# Patient Record
Sex: Male | Born: 1970
Health system: Southern US, Community
[De-identification: ages and names within clinical notes are randomized; demographics above are authoritative.]

## PROBLEM LIST (undated history)

## (undated) DIAGNOSIS — T7840XA Allergy, unspecified, initial encounter: Secondary | ICD-10-CM

## (undated) DIAGNOSIS — K200 Eosinophilic esophagitis: Secondary | ICD-10-CM

## (undated) DIAGNOSIS — K219 Gastro-esophageal reflux disease without esophagitis: Secondary | ICD-10-CM

## (undated) DIAGNOSIS — J45909 Unspecified asthma, uncomplicated: Secondary | ICD-10-CM

## (undated) DIAGNOSIS — E785 Hyperlipidemia, unspecified: Secondary | ICD-10-CM

## (undated) HISTORY — DX: Eosinophilic esophagitis: K20.0

## (undated) HISTORY — DX: Unspecified asthma, uncomplicated: J45.909

## (undated) HISTORY — PX: CHOLECYSTECTOMY: SHX55

## (undated) HISTORY — DX: Hyperlipidemia, unspecified: E78.5

## (undated) HISTORY — DX: Gastro-esophageal reflux disease without esophagitis: K21.9

## (undated) HISTORY — DX: Allergy, unspecified, initial encounter: T78.40XA

---

## 2017-01-21 ENCOUNTER — Encounter: Payer: Self-pay | Admitting: Gastroenterology

## 2017-03-27 ENCOUNTER — Ambulatory Visit: Payer: Self-pay | Admitting: Gastroenterology

## 2017-04-09 ENCOUNTER — Encounter: Payer: Self-pay | Admitting: Gastroenterology

## 2017-04-09 ENCOUNTER — Encounter (INDEPENDENT_AMBULATORY_CARE_PROVIDER_SITE_OTHER): Payer: Self-pay

## 2017-04-09 ENCOUNTER — Ambulatory Visit (INDEPENDENT_AMBULATORY_CARE_PROVIDER_SITE_OTHER): Payer: No Typology Code available for payment source | Admitting: Gastroenterology

## 2017-04-09 VITALS — BP 106/76 | HR 72 | Ht 69.29 in | Wt 217.1 lb

## 2017-04-09 DIAGNOSIS — Z800 Family history of malignant neoplasm of digestive organs: Secondary | ICD-10-CM

## 2017-04-09 DIAGNOSIS — K219 Gastro-esophageal reflux disease without esophagitis: Secondary | ICD-10-CM

## 2017-04-09 NOTE — Patient Instructions (Addendum)
Colonoscopy recall September 2020 (at age 46).  Try changing to ranitidine 150mg  PRN.  Call if you have bowel changes or swallowing difficulties.  Normal BMI (Body Mass Index- based on height and weight) is between 19 and 25. Your BMI today is Body mass index is 31.8 kg/m. Marland Kitchen Please consider follow up  regarding your BMI with your Primary Care Provider.

## 2017-04-09 NOTE — Progress Notes (Signed)
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    Chief complaint is family history colon cancer, intermittent GERD, external hemorrhoids  Colonoscopy 5 years ago in [place], tells me that it was normal. His paternal grandfather had colon cancer. His father had one or 2 polyps removed over the past many years.   Was found to have EoE years ago as well by pathology.  Was on flow-vent. Food tended to go slow.  Symptoms completely responded to PPI.  Pretty rare swallowing issues now.  Twice a year.    Takes PPI only occasionally for heartburn symptoms.  Takes 1-2 times in a month  Sept 2013 normal colonoscopy  He tells me he had a thrombosed hemorrhoid many years ago. He thinks he still has external hemorrhoid tissue but it never really bothers him.  He has no overt GI bleeding, no changes in his bowels recently. He has been trying to lose weight and successfully has lost about 10 pounds in past year or so.  He has really cut back on caffeine. He notices that eating a large meal later at night indefinite predisposing to getting dyspepsia, GERD when laying down.   Old Data Reviewed: None available    Review of systems: Pertinent positive and negative review of systems were noted in the above HPI section. All other review negative.   Past Medical History:  Diagnosis Date  . Asthma   . Eosinophilic esophagitis     Past Surgical History:  Procedure Laterality Date  . CHOLECYSTECTOMY      Current Outpatient Prescriptions  Medication Sig Dispense Refill  . albuterol (PROVENTIL HFA;VENTOLIN HFA) 108 (90 Base) MCG/ACT inhaler Inhale into the lungs as needed for wheezing or shortness of breath. As needed with exercise    . esomeprazole (NEXIUM) 20 MG capsule Take 20 mg by mouth as needed. Over the counter     No current  facility-administered medications for this visit.     Allergies as of 04/09/2017  . (No Known Allergies)    Family History  Problem Relation Age of Onset  . Breast cancer Mother   . ALS Father   . Healthy Sister   . Lymphoma Maternal Grandfather   . Colon cancer Paternal Grandfather     Social History   Social History  . Marital status: Married    Spouse name: N/A  . Number of children: 3  . Years of education: N/A   Occupational History  . radiologist    Social History Main Topics  . Smoking status: Never Smoker  . Smokeless tobacco: Never Used  . Alcohol use Not on file     Comment: occasional  . Drug use: No  . Sexual activity: Not on file   Other Topics Concern  . Not on file   Social History Narrative  . No narrative on file     Physical Exam: Ht 5' 9.29" (1.76 m)   Wt 217 lb 2 oz (98.5 kg)   BMI 31.80 kg/m  Constitutional: generally well-appearing Psychiatric: alert and oriented x3 Eyes: extraocular movements intact Mouth: oral pharynx moist, no lesions Neck: supple no lymphadenopathy Cardiovascular: heart regular rate and rhythm Lungs: clear to auscultation bilaterally Abdomen: soft, nontender, nondistended, no obvious ascites, no peritoneal signs, normal bowel sounds Extremities: no lower extremity edema bilaterally Skin: no lesions on visible extremities  Assessment and plan: 46 y.o. male with  second degree relative with colon cancer, GERD, intermittent dysphasia.  First we discussed family history of colon cancer The second degree relatives such as his grandmother does not significantly increase his own risk. His father did have colon polyps removed and he is very certain there precancerous. We discussed the timing of next screening examination and we agreed to start routine screening at age 27. That would be 7 or 8 years from his first colonoscopy. If it is normal with no precancerous polyps and we would put him in for routine 10 year  recall.  I'm not certain that he truly had eosinophilic esophagitis or just a preponderance of eosinophils related to GERD. His dysphagia symptoms significantly improved with proton pump inhibitor. He never had repeat biopsy after proton pump inhibitors as is more standard care now compared to 5 years ago when he was told he had eosinophilic esophagitis and he really doesn't have much dysphagia now. Maybe once a year or so. I recommended he does take proton pump inhibitor or even H2 blocker on as needed basis for now and to call if he has developed more routine dysphasia or significant GERD symptoms.     Please see the "Patient Instructions" section for addition details about the plan.   Owens Loffler, MD [facility] 04/09/2017, 8:36 AM  Cc: Velna Hatchet, MD

## 2017-11-12 ENCOUNTER — Other Ambulatory Visit (HOSPITAL_COMMUNITY): Payer: Self-pay | Admitting: Internal Medicine

## 2017-11-12 DIAGNOSIS — E041 Nontoxic single thyroid nodule: Secondary | ICD-10-CM

## 2017-11-19 ENCOUNTER — Other Ambulatory Visit: Payer: No Typology Code available for payment source

## 2017-11-19 ENCOUNTER — Ambulatory Visit
Admission: RE | Admit: 2017-11-19 | Discharge: 2017-11-19 | Disposition: A | Discharge status: Home / Self Care | Payer: No Typology Code available for payment source | Source: Ambulatory Visit | Attending: Internal Medicine | Admitting: Internal Medicine

## 2017-11-19 DIAGNOSIS — E041 Nontoxic single thyroid nodule: Secondary | ICD-10-CM

## 2017-11-21 ENCOUNTER — Other Ambulatory Visit (HOSPITAL_COMMUNITY): Payer: Self-pay | Admitting: Internal Medicine

## 2017-11-21 DIAGNOSIS — E041 Nontoxic single thyroid nodule: Secondary | ICD-10-CM

## 2017-11-26 ENCOUNTER — Other Ambulatory Visit (HOSPITAL_COMMUNITY)
Admission: RE | Admit: 2017-11-26 | Discharge: 2017-11-26 | Disposition: A | Discharge status: Home / Self Care | Payer: No Typology Code available for payment source | Source: Ambulatory Visit | Attending: Radiology | Admitting: Radiology

## 2017-11-26 ENCOUNTER — Ambulatory Visit
Admission: RE | Admit: 2017-11-26 | Discharge: 2017-11-26 | Disposition: A | Discharge status: Home / Self Care | Payer: No Typology Code available for payment source | Source: Ambulatory Visit | Attending: Internal Medicine | Admitting: Internal Medicine

## 2017-11-26 DIAGNOSIS — E041 Nontoxic single thyroid nodule: Secondary | ICD-10-CM

## 2018-10-20 DIAGNOSIS — E7849 Other hyperlipidemia: Secondary | ICD-10-CM | POA: Diagnosis not present

## 2018-10-20 DIAGNOSIS — E041 Nontoxic single thyroid nodule: Secondary | ICD-10-CM | POA: Diagnosis not present

## 2018-10-20 DIAGNOSIS — Z125 Encounter for screening for malignant neoplasm of prostate: Secondary | ICD-10-CM | POA: Diagnosis not present

## 2018-10-20 DIAGNOSIS — Z0000 Encounter for general adult medical examination without abnormal findings: Secondary | ICD-10-CM | POA: Diagnosis not present

## 2018-10-20 DIAGNOSIS — R7309 Other abnormal glucose: Secondary | ICD-10-CM | POA: Diagnosis not present

## 2018-10-21 DIAGNOSIS — R7309 Other abnormal glucose: Secondary | ICD-10-CM | POA: Diagnosis not present

## 2018-10-21 DIAGNOSIS — E785 Hyperlipidemia, unspecified: Secondary | ICD-10-CM | POA: Diagnosis not present

## 2018-10-21 DIAGNOSIS — K200 Eosinophilic esophagitis: Secondary | ICD-10-CM | POA: Diagnosis not present

## 2018-10-21 DIAGNOSIS — E041 Nontoxic single thyroid nodule: Secondary | ICD-10-CM | POA: Diagnosis not present

## 2018-10-21 DIAGNOSIS — Z0000 Encounter for general adult medical examination without abnormal findings: Secondary | ICD-10-CM | POA: Diagnosis not present

## 2018-10-21 DIAGNOSIS — Z1331 Encounter for screening for depression: Secondary | ICD-10-CM | POA: Diagnosis not present

## 2018-11-24 DIAGNOSIS — H5213 Myopia, bilateral: Secondary | ICD-10-CM | POA: Diagnosis not present

## 2018-11-24 DIAGNOSIS — H2513 Age-related nuclear cataract, bilateral: Secondary | ICD-10-CM | POA: Diagnosis not present

## 2018-12-24 DIAGNOSIS — E7849 Other hyperlipidemia: Secondary | ICD-10-CM | POA: Diagnosis not present

## 2019-02-20 ENCOUNTER — Encounter: Payer: Self-pay | Admitting: Gastroenterology

## 2019-03-31 DIAGNOSIS — D2272 Melanocytic nevi of left lower limb, including hip: Secondary | ICD-10-CM | POA: Diagnosis not present

## 2019-03-31 DIAGNOSIS — D225 Melanocytic nevi of trunk: Secondary | ICD-10-CM | POA: Diagnosis not present

## 2019-03-31 DIAGNOSIS — D2262 Melanocytic nevi of left upper limb, including shoulder: Secondary | ICD-10-CM | POA: Diagnosis not present

## 2019-03-31 DIAGNOSIS — L821 Other seborrheic keratosis: Secondary | ICD-10-CM | POA: Diagnosis not present

## 2019-04-24 ENCOUNTER — Encounter: Payer: Self-pay | Admitting: Gastroenterology

## 2019-05-26 ENCOUNTER — Ambulatory Visit (AMBULATORY_SURGERY_CENTER): Payer: Self-pay | Admitting: *Deleted

## 2019-05-26 ENCOUNTER — Other Ambulatory Visit: Payer: Self-pay

## 2019-05-26 ENCOUNTER — Telehealth: Payer: Self-pay | Admitting: *Deleted

## 2019-05-26 VITALS — Temp 96.2°F | Ht 69.0 in | Wt 225.6 lb

## 2019-05-26 DIAGNOSIS — Z1211 Encounter for screening for malignant neoplasm of colon: Secondary | ICD-10-CM

## 2019-05-26 MED ORDER — NA SULFATE-K SULFATE-MG SULF 17.5-3.13-1.6 GM/177ML PO SOLN: 1.0000 | Freq: Once | ORAL | 0 refills | Status: AC

## 2019-05-26 NOTE — Telephone Encounter (Signed)
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

## 2019-05-26 NOTE — Progress Notes (Signed)
No egg or soy allergy known to patient  No issues with past sedation with any surgeries  or procedures, no intubation problems  No diet pills per patient No home 02 use per patient  No blood thinners per patient  Pt denies issues with constipation  No A fib or A flutter  EMMI video sent to pt's e mail   Due to the COVID-19 pandemic we are asking patients to follow these guidelines. Please only bring one care partner. Please be aware that your care partner may wait in the car in the parking lot or if they feel like they will be too hot to wait in the car, they may wait in the lobby on the 4th floor. All care partners are required to wear a mask the entire time (we do not have any that we can provide them), they need to practice social distancing, and we will do a Covid check for all patient's and care partners when you arrive. Also we will check their temperature and your temperature. If the care partner waits in their car they need to stay in the parking lot the entire time and we will call them on their cell phone when the patient is ready for discharge so they can bring the car to the front of the building. Also all patient's will need to wear a mask into building.  SUPREP PREP COUPON PROVIDED.

## 2019-05-27 ENCOUNTER — Encounter: Payer: Self-pay | Admitting: Gastroenterology

## 2019-05-27 NOTE — Telephone Encounter (Signed)
Dr. Ardis Hughs, spoke with pt. 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

## 2019-05-27 NOTE — Telephone Encounter (Signed)
Received voicemail,message left for pt,to call back.

## 2019-05-27 NOTE — Telephone Encounter (Signed)
Pt calling back and would like to speak to [facility]

## 2019-05-27 NOTE — Telephone Encounter (Signed)
His colonoscopy is in 2 weeks.  He has 3 options for covid testing sites ([facility], [place] [address] through, [place] [address] through).  Is he unable to get to any of those testing sites at the correct time?

## 2019-05-28 NOTE — Telephone Encounter (Signed)
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   Pt thanked me for working so hard with Adela Lank to get him scheduled and helping with his Delima and was grateful for the assistance.     Lelan Pons PV

## 2019-05-28 NOTE — Telephone Encounter (Signed)
Please let him know I understand this is a definite inconvenience but with the pandemic we have new safety, testing policies.  We do not accept 'outside' test results at this time and I don't expect that to change anytime soon.  I understand if he prefers to reschedule to a more convenient date that allows for him to get pre-procedure testing at one of the 'approved' sites.

## 2019-05-28 NOTE — Telephone Encounter (Signed)
Pt requesting to speak with [name] about the Covid test

## 2019-05-28 NOTE — Telephone Encounter (Signed)
Called pt- got VM- LM to return my call-

## 2019-06-04 ENCOUNTER — Other Ambulatory Visit: Payer: Self-pay | Admitting: Gastroenterology

## 2019-06-04 ENCOUNTER — Ambulatory Visit (INDEPENDENT_AMBULATORY_CARE_PROVIDER_SITE_OTHER): Payer: BC Managed Care – PPO

## 2019-06-04 DIAGNOSIS — Z1159 Encounter for screening for other viral diseases: Secondary | ICD-10-CM

## 2019-06-05 LAB — SARS CORONAVIRUS 2 (TAT 6-24 HRS): SARS Coronavirus 2: NEGATIVE

## 2019-06-09 ENCOUNTER — Other Ambulatory Visit: Payer: Self-pay

## 2019-06-09 ENCOUNTER — Ambulatory Visit (AMBULATORY_SURGERY_CENTER): Payer: BC Managed Care – PPO | Admitting: Gastroenterology

## 2019-06-09 ENCOUNTER — Encounter: Payer: Self-pay | Admitting: Gastroenterology

## 2019-06-09 VITALS — BP 107/74 | HR 78 | Temp 98.0°F | Resp 15 | Ht 69.0 in | Wt 225.0 lb

## 2019-06-09 DIAGNOSIS — D125 Benign neoplasm of sigmoid colon: Secondary | ICD-10-CM

## 2019-06-09 DIAGNOSIS — Z1211 Encounter for screening for malignant neoplasm of colon: Secondary | ICD-10-CM | POA: Diagnosis not present

## 2019-06-09 DIAGNOSIS — D123 Benign neoplasm of transverse colon: Secondary | ICD-10-CM | POA: Diagnosis not present

## 2019-06-09 MED ORDER — SODIUM CHLORIDE 0.9 % IV SOLN: 500.0000 mL | Freq: Once | INTRAVENOUS | Status: DC

## 2019-06-09 NOTE — Patient Instructions (Signed)
Please read all of the handouts given to you by your recovery room nurse.  Thank-you for choosing [place] for your healthcare needs today.  YOU HAD AN ENDOSCOPIC PROCEDURE TODAY AT [facility]:   Refer to the procedure report that was given to you for any specific questions about what was found during the examination.  If the procedure report does not answer your questions, please call your gastroenterologist to clarify.  If you requested that your care partner not be given the details of your procedure findings, then the procedure report has been included in a sealed envelope for you to review at your convenience later.  YOU SHOULD EXPECT: Some feelings of bloating in the abdomen. Passage of more gas than usual.  Walking can help get rid of the air that was put into your GI tract during the procedure and reduce the bloating. If you had a lower endoscopy (such as a colonoscopy or flexible sigmoidoscopy) you may notice spotting of blood in your stool or on the toilet paper. If you underwent a bowel prep for your procedure, you may not have a normal bowel movement for a few days.  Please Note:  You might notice some irritation and congestion in your nose or some drainage.  This is from the oxygen used during your procedure.  There is no need for concern and it should clear up in a day or so.  SYMPTOMS TO REPORT IMMEDIATELY:   Following lower endoscopy (colonoscopy or flexible sigmoidoscopy):  Excessive amounts of blood in the stool  Significant tenderness or worsening of abdominal pains  Swelling of the abdomen that is new, acute  Fever of 100F or higher   For urgent or emergent issues, a gastroenterologist can be reached at any hour by calling [phone].   DIET:  We do recommend a small meal at first, but then you may proceed to your regular diet.  Drink plenty of fluids but you should avoid alcoholic beverages for 24 hours.  ACTIVITY:  You should plan to take it easy for the  rest of today and you should NOT DRIVE or use heavy machinery until tomorrow (because of the sedation medicines used during the test).    FOLLOW UP: Our staff will call the number listed on your records 48-72 hours following your procedure to check on you and address any questions or concerns that you may have regarding the information given to you following your procedure. If we do not reach you, we will leave a message.  We will attempt to reach you two times.  During this call, we will ask if you have developed any symptoms of COVID 19. If you develop any symptoms (ie: fever, flu-like symptoms, shortness of breath, cough etc.) before then, please call [phone].  If you test positive for Covid 19 in the 2 weeks post procedure, please call and report this information to [place].    If any biopsies were taken you will be contacted by phone or by letter within the next 1-3 weeks.  Please call us at [phone] if you have not heard about the biopsies in 3 weeks.    SIGNATURES/CONFIDENTIALITY: You and/or your care partner have signed paperwork which will be entered into your electronic medical record.  These signatures attest to the fact that that the information above on your After Visit Summary has been reviewed and is understood.  Full responsibility of the confidentiality of this discharge information lies with you and/or your care-partner.

## 2019-06-09 NOTE — Progress Notes (Signed)
Called to room to assist during endoscopic procedure.  Patient ID and intended procedure confirmed with present staff. Received instructions for my participation in the procedure from the performing physician.  

## 2019-06-09 NOTE — Progress Notes (Signed)
PT taken to PACU. Monitors in place. VSS. Report given to RN. 

## 2019-06-09 NOTE — Progress Notes (Signed)
Temp check by:LC Vital check by:SM, RN  The patient states no changes in medical or surgical history since pre-visit screening on 05/26/2019.

## 2019-06-09 NOTE — Op Note (Signed)
[place] Patient Name: Furious Writer Procedure Date: 06/09/2019 8:34 AM MRN: [id number] Endoscopist: Milus Banister , MD Age: 48 Referring MD:  Date of Birth: 07-11-1971 Gender: Male Account #: [id number] Procedure:                Colonoscopy Indications:              Screening for colorectal malignant neoplasm Medicines:                Monitored Anesthesia Care Procedure:                Pre-Anesthesia Assessment:                           - Prior to the procedure, a History and Physical                            was performed, and patient medications and                            allergies were reviewed. The patient's tolerance of                            previous anesthesia was also reviewed. The risks                            and benefits of the procedure and the sedation                            options and risks were discussed with the patient.                            All questions were answered, and informed consent                            was obtained. Prior Anticoagulants: The patient has                            taken no previous anticoagulant or antiplatelet                            agents. ASA Grade Assessment: II - A patient with                            mild systemic disease. After reviewing the risks                            and benefits, the patient was deemed in                            satisfactory condition to undergo the procedure.                           After obtaining informed consent, the colonoscope  was passed under direct vision. Throughout the                            procedure, the patient's blood pressure, pulse, and                            oxygen saturations were monitored continuously. The                            Colonoscope was introduced through the anus and                            advanced to the the cecum, identified by                            appendiceal orifice and  ileocecal valve. The                            colonoscopy was performed without difficulty. The                            patient tolerated the procedure well. The quality                            of the bowel preparation was good. The ileocecal                            valve, appendiceal orifice, and rectum were                            photographed. Scope In: 8:44:40 AM Scope Out: 8:57:45 AM Scope Withdrawal Time: 0 hours 11 minutes 13 seconds  Total Procedure Duration: 0 hours 13 minutes 5 seconds  Findings:                 Two polyps were found in the sigmoid colon                            (pedunculated) and transverse colon (sessile). The                            polyps were 2 to 6 mm in size. These polyps were                            removed with a cold snare. Resection and retrieval                            were complete.                           The exam was otherwise without abnormality on                            direct and retroflexion views. Complications:  No immediate complications. Estimated blood loss:                            None. Estimated Blood Loss:     Estimated blood loss: none. Impression:               - Two 2 to 6 mm polyps in the sigmoid colon and in                            the transverse colon, removed with a cold snare.                            Resected and retrieved.                           - The examination was otherwise normal on direct                            and retroflexion views. Recommendation:           - Patient has a contact number available for                            emergencies. The signs and symptoms of potential                            delayed complications were discussed with the                            patient. Return to normal activities tomorrow.                            Written discharge instructions were provided to the                            patient.                           -  Resume previous diet.                           - Continue present medications.                           - Await pathology results. Milus Banister, MD 06/09/2019 9:00:31 AM This report has been signed electronically.

## 2019-06-11 ENCOUNTER — Telehealth: Payer: Self-pay

## 2019-06-11 NOTE — Telephone Encounter (Signed)
  Follow up Call-  Call back number 06/09/2019  Post procedure Call Back phone  # [phone]  Permission to leave phone message Yes     Patient questions:  Do you have a fever, pain , or abdominal swelling? No. Pain Score  0 *  Have you tolerated food without any problems? Yes  Have you been able to return to your normal activities? Yes  Do you have any questions about your discharge instructions: Diet   No. Medications  No. Follow up visit  No.  Do you have questions or concerns about your Care? No.  Actions: * If pain score is 4 or above: No action needed, pain <4. Have you developed a fever since your procedure? No 2.   Have you had an respiratory symptoms (SOB or cough) since your procedure? No 3.   Have you tested positive for COVID 19 since your procedure No  4.   Have you had any family members/close contacts diagnosed with the COVID 19 since your procedure?  No   If yes to any of these questions please route to Joylene John, RN and Alphonsa Gin, RN.

## 2019-06-14 ENCOUNTER — Encounter: Payer: Self-pay | Admitting: Gastroenterology

## 2019-06-24 DIAGNOSIS — L821 Other seborrheic keratosis: Secondary | ICD-10-CM | POA: Diagnosis not present

## 2019-06-24 DIAGNOSIS — D485 Neoplasm of uncertain behavior of skin: Secondary | ICD-10-CM | POA: Diagnosis not present

## 2019-08-04 DIAGNOSIS — J3081 Allergic rhinitis due to animal (cat) (dog) hair and dander: Secondary | ICD-10-CM | POA: Diagnosis not present

## 2019-08-04 DIAGNOSIS — J3089 Other allergic rhinitis: Secondary | ICD-10-CM | POA: Diagnosis not present

## 2019-08-04 DIAGNOSIS — J301 Allergic rhinitis due to pollen: Secondary | ICD-10-CM | POA: Diagnosis not present

## 2019-08-11 DIAGNOSIS — J301 Allergic rhinitis due to pollen: Secondary | ICD-10-CM | POA: Diagnosis not present

## 2019-08-11 DIAGNOSIS — J3089 Other allergic rhinitis: Secondary | ICD-10-CM | POA: Diagnosis not present

## 2019-08-11 DIAGNOSIS — J3081 Allergic rhinitis due to animal (cat) (dog) hair and dander: Secondary | ICD-10-CM | POA: Diagnosis not present

## 2019-08-17 DIAGNOSIS — J301 Allergic rhinitis due to pollen: Secondary | ICD-10-CM | POA: Diagnosis not present

## 2019-08-17 DIAGNOSIS — J3081 Allergic rhinitis due to animal (cat) (dog) hair and dander: Secondary | ICD-10-CM | POA: Diagnosis not present

## 2019-08-17 DIAGNOSIS — J3089 Other allergic rhinitis: Secondary | ICD-10-CM | POA: Diagnosis not present

## 2019-08-25 DIAGNOSIS — J301 Allergic rhinitis due to pollen: Secondary | ICD-10-CM | POA: Diagnosis not present

## 2019-08-25 DIAGNOSIS — J3089 Other allergic rhinitis: Secondary | ICD-10-CM | POA: Diagnosis not present

## 2019-08-25 DIAGNOSIS — J3081 Allergic rhinitis due to animal (cat) (dog) hair and dander: Secondary | ICD-10-CM | POA: Diagnosis not present

## 2019-08-31 DIAGNOSIS — J3089 Other allergic rhinitis: Secondary | ICD-10-CM | POA: Diagnosis not present

## 2019-08-31 DIAGNOSIS — J3081 Allergic rhinitis due to animal (cat) (dog) hair and dander: Secondary | ICD-10-CM | POA: Diagnosis not present

## 2019-08-31 DIAGNOSIS — J301 Allergic rhinitis due to pollen: Secondary | ICD-10-CM | POA: Diagnosis not present

## 2019-09-08 DIAGNOSIS — J3089 Other allergic rhinitis: Secondary | ICD-10-CM | POA: Diagnosis not present

## 2019-09-08 DIAGNOSIS — J3081 Allergic rhinitis due to animal (cat) (dog) hair and dander: Secondary | ICD-10-CM | POA: Diagnosis not present

## 2019-09-08 DIAGNOSIS — J301 Allergic rhinitis due to pollen: Secondary | ICD-10-CM | POA: Diagnosis not present

## 2019-09-28 DIAGNOSIS — J301 Allergic rhinitis due to pollen: Secondary | ICD-10-CM | POA: Diagnosis not present

## 2019-09-28 DIAGNOSIS — J3081 Allergic rhinitis due to animal (cat) (dog) hair and dander: Secondary | ICD-10-CM | POA: Diagnosis not present

## 2019-09-28 DIAGNOSIS — J3089 Other allergic rhinitis: Secondary | ICD-10-CM | POA: Diagnosis not present

## 2019-10-05 DIAGNOSIS — J3089 Other allergic rhinitis: Secondary | ICD-10-CM | POA: Diagnosis not present

## 2019-10-05 DIAGNOSIS — J301 Allergic rhinitis due to pollen: Secondary | ICD-10-CM | POA: Diagnosis not present

## 2019-10-05 DIAGNOSIS — J3081 Allergic rhinitis due to animal (cat) (dog) hair and dander: Secondary | ICD-10-CM | POA: Diagnosis not present

## 2019-10-13 DIAGNOSIS — J3089 Other allergic rhinitis: Secondary | ICD-10-CM | POA: Diagnosis not present

## 2019-10-13 DIAGNOSIS — J3081 Allergic rhinitis due to animal (cat) (dog) hair and dander: Secondary | ICD-10-CM | POA: Diagnosis not present

## 2019-10-13 DIAGNOSIS — J301 Allergic rhinitis due to pollen: Secondary | ICD-10-CM | POA: Diagnosis not present

## 2019-10-20 DIAGNOSIS — J3081 Allergic rhinitis due to animal (cat) (dog) hair and dander: Secondary | ICD-10-CM | POA: Diagnosis not present

## 2019-10-20 DIAGNOSIS — J3089 Other allergic rhinitis: Secondary | ICD-10-CM | POA: Diagnosis not present

## 2019-10-20 DIAGNOSIS — J301 Allergic rhinitis due to pollen: Secondary | ICD-10-CM | POA: Diagnosis not present

## 2019-10-27 DIAGNOSIS — J3089 Other allergic rhinitis: Secondary | ICD-10-CM | POA: Diagnosis not present

## 2019-10-27 DIAGNOSIS — J3081 Allergic rhinitis due to animal (cat) (dog) hair and dander: Secondary | ICD-10-CM | POA: Diagnosis not present

## 2019-10-27 DIAGNOSIS — J301 Allergic rhinitis due to pollen: Secondary | ICD-10-CM | POA: Diagnosis not present

## 2019-11-02 DIAGNOSIS — L649 Androgenic alopecia, unspecified: Secondary | ICD-10-CM | POA: Diagnosis not present

## 2019-11-16 DIAGNOSIS — Z0000 Encounter for general adult medical examination without abnormal findings: Secondary | ICD-10-CM | POA: Diagnosis not present

## 2019-11-16 DIAGNOSIS — R7309 Other abnormal glucose: Secondary | ICD-10-CM | POA: Diagnosis not present

## 2019-11-16 DIAGNOSIS — E041 Nontoxic single thyroid nodule: Secondary | ICD-10-CM | POA: Diagnosis not present

## 2019-11-16 DIAGNOSIS — Z125 Encounter for screening for malignant neoplasm of prostate: Secondary | ICD-10-CM | POA: Diagnosis not present

## 2019-11-16 DIAGNOSIS — E7849 Other hyperlipidemia: Secondary | ICD-10-CM | POA: Diagnosis not present

## 2019-11-25 DIAGNOSIS — E7849 Other hyperlipidemia: Secondary | ICD-10-CM | POA: Diagnosis not present

## 2019-11-25 DIAGNOSIS — E041 Nontoxic single thyroid nodule: Secondary | ICD-10-CM | POA: Diagnosis not present

## 2019-11-25 DIAGNOSIS — J45909 Unspecified asthma, uncomplicated: Secondary | ICD-10-CM | POA: Diagnosis not present

## 2019-11-25 DIAGNOSIS — R82998 Other abnormal findings in urine: Secondary | ICD-10-CM | POA: Diagnosis not present

## 2019-11-25 DIAGNOSIS — L659 Nonscarring hair loss, unspecified: Secondary | ICD-10-CM | POA: Diagnosis not present

## 2019-11-25 DIAGNOSIS — Z0000 Encounter for general adult medical examination without abnormal findings: Secondary | ICD-10-CM | POA: Diagnosis not present

## 2019-11-27 DIAGNOSIS — Z1212 Encounter for screening for malignant neoplasm of rectum: Secondary | ICD-10-CM | POA: Diagnosis not present

## 2020-05-12 DIAGNOSIS — C4441 Basal cell carcinoma of skin of scalp and neck: Secondary | ICD-10-CM | POA: Diagnosis not present

## 2020-05-12 DIAGNOSIS — L821 Other seborrheic keratosis: Secondary | ICD-10-CM | POA: Diagnosis not present

## 2020-05-12 DIAGNOSIS — D2262 Melanocytic nevi of left upper limb, including shoulder: Secondary | ICD-10-CM | POA: Diagnosis not present

## 2020-05-12 DIAGNOSIS — C44212 Basal cell carcinoma of skin of right ear and external auricular canal: Secondary | ICD-10-CM | POA: Diagnosis not present

## 2020-05-12 DIAGNOSIS — D225 Melanocytic nevi of trunk: Secondary | ICD-10-CM | POA: Diagnosis not present

## 2020-05-12 DIAGNOSIS — D2272 Melanocytic nevi of left lower limb, including hip: Secondary | ICD-10-CM | POA: Diagnosis not present

## 2020-06-14 DIAGNOSIS — C44212 Basal cell carcinoma of skin of right ear and external auricular canal: Secondary | ICD-10-CM | POA: Diagnosis not present

## 2020-07-01 DIAGNOSIS — H5213 Myopia, bilateral: Secondary | ICD-10-CM | POA: Diagnosis not present

## 2020-07-01 DIAGNOSIS — H2513 Age-related nuclear cataract, bilateral: Secondary | ICD-10-CM | POA: Diagnosis not present

## 2020-11-21 DIAGNOSIS — E041 Nontoxic single thyroid nodule: Secondary | ICD-10-CM | POA: Diagnosis not present

## 2020-11-21 DIAGNOSIS — Z125 Encounter for screening for malignant neoplasm of prostate: Secondary | ICD-10-CM | POA: Diagnosis not present

## 2020-11-21 DIAGNOSIS — R739 Hyperglycemia, unspecified: Secondary | ICD-10-CM | POA: Diagnosis not present

## 2020-11-21 DIAGNOSIS — E785 Hyperlipidemia, unspecified: Secondary | ICD-10-CM | POA: Diagnosis not present

## 2020-12-12 DIAGNOSIS — Z0000 Encounter for general adult medical examination without abnormal findings: Secondary | ICD-10-CM | POA: Diagnosis not present

## 2020-12-12 DIAGNOSIS — E785 Hyperlipidemia, unspecified: Secondary | ICD-10-CM | POA: Diagnosis not present

## 2020-12-13 DIAGNOSIS — R82998 Other abnormal findings in urine: Secondary | ICD-10-CM | POA: Diagnosis not present

## 2021-01-24 DIAGNOSIS — H5712 Ocular pain, left eye: Secondary | ICD-10-CM | POA: Diagnosis not present

## 2021-01-24 DIAGNOSIS — H1032 Unspecified acute conjunctivitis, left eye: Secondary | ICD-10-CM | POA: Diagnosis not present

## 2021-01-24 DIAGNOSIS — Z79899 Other long term (current) drug therapy: Secondary | ICD-10-CM | POA: Diagnosis not present

## 2021-01-24 DIAGNOSIS — E785 Hyperlipidemia, unspecified: Secondary | ICD-10-CM | POA: Diagnosis not present

## 2021-01-24 DIAGNOSIS — H5789 Other specified disorders of eye and adnexa: Secondary | ICD-10-CM | POA: Diagnosis not present

## 2021-01-24 DIAGNOSIS — B9689 Other specified bacterial agents as the cause of diseases classified elsewhere: Secondary | ICD-10-CM | POA: Diagnosis not present

## 2021-01-24 DIAGNOSIS — H1089 Other conjunctivitis: Secondary | ICD-10-CM | POA: Diagnosis not present

## 2021-02-24 DIAGNOSIS — M65221 Calcific tendinitis, right upper arm: Secondary | ICD-10-CM | POA: Diagnosis not present

## 2021-03-06 DIAGNOSIS — M25551 Pain in right hip: Secondary | ICD-10-CM | POA: Diagnosis not present

## 2021-04-21 DIAGNOSIS — M7631 Iliotibial band syndrome, right leg: Secondary | ICD-10-CM | POA: Diagnosis not present

## 2021-04-21 DIAGNOSIS — M7061 Trochanteric bursitis, right hip: Secondary | ICD-10-CM | POA: Diagnosis not present

## 2021-04-21 DIAGNOSIS — M65221 Calcific tendinitis, right upper arm: Secondary | ICD-10-CM | POA: Diagnosis not present

## 2021-04-24 ENCOUNTER — Other Ambulatory Visit: Payer: Self-pay | Admitting: Orthopedic Surgery

## 2021-04-24 DIAGNOSIS — M65221 Calcific tendinitis, right upper arm: Secondary | ICD-10-CM

## 2021-04-30 ENCOUNTER — Ambulatory Visit
Admission: RE | Admit: 2021-04-30 | Discharge: 2021-04-30 | Disposition: A | Discharge status: Home / Self Care | Payer: BC Managed Care – PPO | Source: Ambulatory Visit | Attending: Orthopedic Surgery | Admitting: Orthopedic Surgery

## 2021-04-30 DIAGNOSIS — M25511 Pain in right shoulder: Secondary | ICD-10-CM | POA: Diagnosis not present

## 2021-04-30 DIAGNOSIS — M65221 Calcific tendinitis, right upper arm: Secondary | ICD-10-CM

## 2021-04-30 DIAGNOSIS — M19011 Primary osteoarthritis, right shoulder: Secondary | ICD-10-CM | POA: Diagnosis not present

## 2021-05-12 DIAGNOSIS — M67911 Unspecified disorder of synovium and tendon, right shoulder: Secondary | ICD-10-CM | POA: Diagnosis not present

## 2021-05-30 DIAGNOSIS — M7501 Adhesive capsulitis of right shoulder: Secondary | ICD-10-CM | POA: Diagnosis not present

## 2021-05-30 DIAGNOSIS — G8918 Other acute postprocedural pain: Secondary | ICD-10-CM | POA: Diagnosis not present

## 2021-05-30 DIAGNOSIS — M67813 Other specified disorders of tendon, right shoulder: Secondary | ICD-10-CM | POA: Diagnosis not present

## 2021-05-30 DIAGNOSIS — M7551 Bursitis of right shoulder: Secondary | ICD-10-CM | POA: Diagnosis not present

## 2021-05-30 DIAGNOSIS — M75111 Incomplete rotator cuff tear or rupture of right shoulder, not specified as traumatic: Secondary | ICD-10-CM | POA: Diagnosis not present

## 2021-05-30 DIAGNOSIS — M7541 Impingement syndrome of right shoulder: Secondary | ICD-10-CM | POA: Diagnosis not present

## 2021-05-30 DIAGNOSIS — M7531 Calcific tendinitis of right shoulder: Secondary | ICD-10-CM | POA: Diagnosis not present

## 2021-05-30 DIAGNOSIS — S43431A Superior glenoid labrum lesion of right shoulder, initial encounter: Secondary | ICD-10-CM | POA: Diagnosis not present

## 2021-06-16 DIAGNOSIS — E785 Hyperlipidemia, unspecified: Secondary | ICD-10-CM | POA: Diagnosis not present

## 2021-07-10 DIAGNOSIS — D235 Other benign neoplasm of skin of trunk: Secondary | ICD-10-CM | POA: Diagnosis not present

## 2021-07-10 DIAGNOSIS — D225 Melanocytic nevi of trunk: Secondary | ICD-10-CM | POA: Diagnosis not present

## 2021-07-10 DIAGNOSIS — D1801 Hemangioma of skin and subcutaneous tissue: Secondary | ICD-10-CM | POA: Diagnosis not present

## 2021-07-10 DIAGNOSIS — Z85828 Personal history of other malignant neoplasm of skin: Secondary | ICD-10-CM | POA: Diagnosis not present

## 2021-08-22 ENCOUNTER — Other Ambulatory Visit: Payer: Self-pay

## 2021-08-22 ENCOUNTER — Emergency Department (HOSPITAL_COMMUNITY)
Admission: EM | Admit: 2021-08-22 | Discharge: 2021-08-22 | Disposition: A | Discharge status: Home / Self Care | Payer: BC Managed Care – PPO | Attending: Emergency Medicine | Admitting: Emergency Medicine

## 2021-08-22 DIAGNOSIS — I491 Atrial premature depolarization: Secondary | ICD-10-CM | POA: Diagnosis not present

## 2021-08-22 DIAGNOSIS — Z79899 Other long term (current) drug therapy: Secondary | ICD-10-CM | POA: Insufficient documentation

## 2021-08-22 DIAGNOSIS — R002 Palpitations: Secondary | ICD-10-CM | POA: Diagnosis not present

## 2021-08-22 LAB — BASIC METABOLIC PANEL
Anion gap: 7 (ref 5–15)
BUN: 15 mg/dL (ref 6–20)
CO2: 27 mmol/L (ref 22–32)
Calcium: 9.4 mg/dL (ref 8.9–10.3)
Chloride: 106 mmol/L (ref 98–111)
Creatinine, Ser: 0.98 mg/dL (ref 0.61–1.24)
GFR, Estimated: >60 mL/min (ref >60)
Glucose, Bld: 128 mg/dL — ABNORMAL HIGH (ref 70–99)
Potassium: 4 mmol/L (ref 3.5–5.1)
Sodium: 140 mmol/L (ref 135–145)

## 2021-08-22 LAB — CBC
HCT: 41.6 % (ref 39.0–52.0)
Hemoglobin: 14.3 g/dL (ref 13.0–17.0)
MCH: 28.9 pg (ref 26.0–34.0)
MCHC: 34.4 g/dL (ref 30.0–36.0)
MCV: 84.2 fL (ref 80.0–100.0)
Platelets: 182 10*3/uL (ref 150–400)
RBC: 4.94 MIL/uL (ref 4.22–5.81)
RDW: 12.6 % (ref 11.5–15.5)
WBC: 6.2 10*3/uL (ref 4.0–10.5)
nRBC: 0 % (ref 0.0–0.2)

## 2021-08-22 NOTE — ED Triage Notes (Signed)
Pt with intermittent palpitations that started this afternoon. Denies sob or chest pain. Abnormal EKG on apple watch.

## 2021-08-22 NOTE — ED Provider Notes (Signed)
[facility] EMERGENCY DEPARTMENT Provider Note   CSN: [id number] Arrival date & time: 08/22/21  1459     History  Chief Complaint  Patient presents with   Palpitations    Jeffrey Pruitt is a 51 y.o. male.  D27-year-old male with prior medical history as detailed below presents for evaluation.  Patient reports that he was at work earlier today.  He started to feel some palpitations.  These were intermittent.  They were not associated with syncope or near syncopal symptoms.  He denies associated chest pain.  He denies shortness of breath.  He was able to check his EKG through his apple watch.  He reports seeing several premature atrial contractions.  Patient admits to limited sleep over the last several days.  He reports poor hydration of the last several days.  He admits to using caffeine earlier today which is not his normal.  He denies prior history of cardiac disease.  He has established PCP.  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  The history is provided by the patient and a relative.  Palpitations Palpitations quality:  Fast Onset quality:  Sudden Duration:  5 seconds Timing:  Intermittent Progression:  Resolved Chronicity:  New Context: caffeine and dehydration   Context comment:  Limited sleep over the last several days Relieved by:  Nothing Worsened by:  Nothing     Home Medications Prior to Admission medications   Medication Sig Start Date End Date Taking? Authorizing Provider  albuterol (PROVENTIL HFA;VENTOLIN HFA) 108 (90 Base) MCG/ACT inhaler Inhale into the lungs as needed for wheezing or shortness of breath. As needed with exercise    [provider]  esomeprazole (NEXIUM) 20 MG capsule Take 20 mg by mouth as needed. Over the counter    [provider]  rosuvastatin (CRESTOR) 10 MG tablet Take 10 mg by mouth daily.    [provider]      Allergies    Patient has no known allergies.    Review of  Systems   Review of Systems  Cardiovascular:  Positive for palpitations.  All other systems reviewed and are negative.  Physical Exam Updated Vital Signs BP (!) 146/96    Pulse 76    Temp 98.9 F (37.2 C) (Oral)    Resp 18    Ht 5\' 10"  (1.778 m)    Wt 99.8 kg    SpO2 99%    BMI 31.57 kg/m  Physical Exam Vitals and nursing note reviewed.  Constitutional:      General: He is not in acute distress.    Appearance: Normal appearance. He is well-developed.  HENT:     Head: Normocephalic and atraumatic.  Eyes:     Pupils: Pupils are equal, round, and reactive to light.  Cardiovascular:     Rate and Rhythm: Normal rate and regular rhythm.     Heart sounds: Normal heart sounds.  Pulmonary:     Effort: Pulmonary effort is normal. No respiratory distress.     Breath sounds: Normal breath sounds.  Abdominal:     General: There is no distension.     Tenderness: There is no abdominal tenderness.  Musculoskeletal:        General: No deformity. Normal range of motion.     Cervical back: Normal range of motion.  Skin:    General: Skin is warm.  Neurological:     General: No focal deficit present.     Mental Status: He is alert  and oriented to person, place, and time.    ED Results / Procedures / Treatments   Labs (all labs ordered are listed, but only abnormal results are displayed) Labs Reviewed  BASIC METABOLIC PANEL - Abnormal; Notable for the following components:      Result Value   Glucose, Bld 128 (*)    All other components within normal limits  CBC    EKG EKG Interpretation  Date/Time:  Tuesday August 22 2021 15:10:56 EST Ventricular Rate:  82 PR Interval:  154 QRS Duration: 86 QT Interval:  360 QTC Calculation: 420 R Axis:   111 Text Interpretation: Normal sinus rhythm Left posterior fascicular block Abnormal QRS-T angle, consider primary T wave abnormality Abnormal ECG No previous ECGs available Confirmed by Dene Gentry [phone]) on 08/22/2021 5:04:20  PM  Radiology No results found.  Procedures Procedures    Medications Ordered in ED Medications - No data to display  ED Course/ Medical Decision Making/ A&P                           Medical Decision Making   Medical Screen Complete  This patient presented to the ED with complaint of palpitation.  This complaint involves an extensive number of treatment options. The initial differential diagnosis includes, but is not limited to, arrhythmia, metabolic abnormality, etc.  This presentation is: Acute, Self-Limited, Previously Undiagnosed, Uncertain Prognosis, and Complicated Patient presents with complaint of intermittent palpitations during work today.  Patient's rhythm during event was recorded by his apple watch.  Review of apple watch reveals likely premature atrial contractions.  During monitoring the ED patient is noted to have several premature atrial contractions versus PVCs on monitoring.  Patient is minimally symptomatic from same.  Baseline labs including potassium are without significant abnormality.  Patient without associated chest pain or shortness of breath.  Patient does report caffeine use, mild dehydration, and minimal sleep over the last several days.  These are all likely precipitants of his PACs.  Patient does have established PCP.  He plans on following up with his PCP.  Discussion of outpatient follow-up included possibly seeing cardiology and possibly using event monitor or other outpatient cardiac monitoring.  Importance of close follow-up is stressed.  Strict return precautions given and understood.     Additional history obtained:  Additional history obtained from Spouse External records from outside sources obtained and reviewed including prior ED visits and prior Inpatient records.    Lab Tests:  I ordered and personally interpreted labs.  The pertinent results include:  cbc bmp   Cardiac Monitoring:  The patient was maintained on a  cardiac monitor.  I personally viewed and interpreted the cardiac monitor which showed an underlying rhythm of: Predominantly normal sinus rhythm with scattered intermittent PVC versus PAC     Problem List / ED Course:  Palpitations, likely PACs   Disposition:  After consideration of the diagnostic results and the patients response to treatment, I feel that the patent would benefit from close outpatient follow-up.           Final Clinical Impression(s) / ED Diagnoses Final diagnoses:  Palpitations  Premature atrial contraction    Rx / DC Orders ED Discharge Orders     None         Valarie Merino, MD 08/22/21 1730

## 2021-08-22 NOTE — ED Provider Triage Note (Signed)
Emergency Medicine Provider Triage Evaluation Note  Jeffrey Pruitt , a 51 y.o. male  was evaluated in triage.  Pt complains of palpitations starting today.  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  He initially did not think anything of it, but it is happened several times since then.  He was using the EKG feature on his apple watch that appeared as though he was dropping some heartbeats.  Review of Systems  Positive: Palpitations Negative: Chest pain, shortness of breath, nausea, vomiting  Physical Exam  BP (!) 143/108    Pulse 84    Temp 98.9 F (37.2 C) (Oral)    Resp 18    SpO2 98%  Gen:   Awake, no distress   Resp:  Normal effort  MSK:   Moves extremities without difficulty  Other:    Medical Decision Making  Medically screening exam initiated at 3:15 PM.  Appropriate orders placed.  Alyn Jurney was informed that the remainder of the evaluation will be completed by another provider, this initial triage assessment does not replace that evaluation, and the importance of remaining in the ED until their evaluation is complete.     Kateri Plummer, PA-C 08/22/21 1516

## 2021-08-22 NOTE — Discharge Instructions (Addendum)
Return for any problem.  Follow-up with Dr. Ardeth Perfect as discussed.

## 2021-08-22 NOTE — ED Notes (Addendum)
Discharge instructions reviewed with patient and significant other. Patient and significant other verbalized understanding of instructions. Follow-up care and medications were reviewed. Patient ambulatory with steady gait. VSS upon discharge.

## 2021-09-23 ENCOUNTER — Ambulatory Visit (HOSPITAL_COMMUNITY)
Admission: RE | Admit: 2021-09-23 | Discharge: 2021-09-23 | Disposition: A | Discharge status: Home / Self Care | Payer: BC Managed Care – PPO | Source: Ambulatory Visit | Attending: Urgent Care | Admitting: Urgent Care

## 2021-09-23 ENCOUNTER — Other Ambulatory Visit: Payer: Self-pay

## 2021-09-23 ENCOUNTER — Encounter (HOSPITAL_COMMUNITY): Payer: Self-pay

## 2021-09-23 VITALS — BP 129/87 | HR 84 | Temp 97.7°F | Resp 20

## 2021-09-23 DIAGNOSIS — B353 Tinea pedis: Secondary | ICD-10-CM | POA: Diagnosis not present

## 2021-09-23 MED ORDER — KETOCONAZOLE 2 % EX CREA: 1.0000 "application " | TOPICAL_CREAM | Freq: Every day | CUTANEOUS | 0 refills | Status: AC

## 2021-09-23 MED ORDER — MUPIROCIN 2 % EX OINT: 1.0000 "application " | TOPICAL_OINTMENT | Freq: Three times a day (TID) | CUTANEOUS | 0 refills | Status: AC

## 2021-09-23 NOTE — ED Triage Notes (Signed)
Pt c/o foot rash for 2 weeks.  ?

## 2021-09-23 NOTE — ED Provider Notes (Signed)
?[place] ? ? ?MRN: [id number] DOB: Dec 01, 1970 ? ?Subjective:  ? ?Jeffrey Pruitt is a 51 y.o. male presenting for 2-week history of rash over the medial aspect of his left foot.  The rash generally does not bother him too much.  It started out with a couple of red spots and then spread to over the toe.  He did develop some over the lateral aspect of the left foot as well.  Has been using clotrimazole cream once daily for the past week on it.  Reports that it is improving.  Just wanted to make sure he got checked before he traveled.  No tenderness, drainage of pus or bleeding. ? ?No current facility-administered medications for this encounter. ? ?Current Outpatient Medications:  ?  albuterol (PROVENTIL HFA;VENTOLIN HFA) 108 (90 Base) MCG/ACT inhaler, Inhale into the lungs as needed for wheezing or shortness of breath. As needed with exercise, Disp: , Rfl:  ?  esomeprazole (NEXIUM) 20 MG capsule, Take 20 mg by mouth as needed. Over the counter, Disp: , Rfl:  ?  rosuvastatin (CRESTOR) 10 MG tablet, Take 10 mg by mouth daily., Disp: , Rfl:   ? ?No Known Allergies ? ?Past Medical History:  ?Diagnosis Date  ? Allergy   ? Asthma   ? Eosinophilic esophagitis   ? GERD (gastroesophageal reflux disease)   ? Hyperlipidemia   ?  ? ?Past Surgical History:  ?Procedure Laterality Date  ? CHOLECYSTECTOMY    ? ? ?Family History  ?Problem Relation Age of Onset  ? Breast cancer Mother   ? ALS Father   ? Healthy Sister   ? Lymphoma Maternal Grandfather   ? Colon cancer Paternal Grandfather   ? Esophageal cancer Neg Hx   ? Rectal cancer Neg Hx   ? Prostate cancer Neg Hx   ? Stomach cancer Neg Hx   ? ? ?Social History  ? ?Tobacco Use  ? Smoking status: Never  ? Smokeless tobacco: Never  ?Vaping Use  ? Vaping Use: Never used  ?Substance Use Topics  ? Drug use: No  ? ? ?ROS ? ? ?Objective:  ? ?Vitals: ?BP 129/87 (BP Location: Left Arm)   Pulse 84   Temp 97.7 ?F (36.5 ?C) (Oral)   Resp 20   SpO2 96%  ? ?Physical  Exam ?Constitutional:   ?   General: He is not in acute distress. ?   Appearance: Normal appearance. He is well-developed and normal weight. He is not ill-appearing, toxic-appearing or diaphoretic.  ?HENT:  ?   Head: Normocephalic and atraumatic.  ?   Right Ear: External ear normal.  ?   Left Ear: External ear normal.  ?   Nose: Nose normal.  ?   Mouth/Throat:  ?   Pharynx: Oropharynx is clear.  ?Eyes:  ?   General: No scleral icterus.    ?   Right eye: No discharge.     ?   Left eye: No discharge.  ?   Extraocular Movements: Extraocular movements intact.  ?Cardiovascular:  ?   Rate and Rhythm: Normal rate.  ?Pulmonary:  ?   Effort: Pulmonary effort is normal.  ?Musculoskeletal:  ?   Cervical back: Normal range of motion.  ?     Feet: ? ?Neurological:  ?   Mental Status: He is alert and oriented to person, place, and time.  ?Psychiatric:     ?   Mood and Affect: Mood normal.     ?   Behavior:  Behavior normal.     ?   Thought Content: Thought content normal.     ?   Judgment: Judgment normal.  ? ? ?Assessment and Plan :  ? ?PDMP not reviewed this encounter. ? ?1. Tinea pedis of left foot   ? ?Will cover for tinea pedis with ketoconazole.  Offered him a prescription for mupirocin given that he is going to go traveling soon.  Discussed appropriate applications for this specifically for impetigo of the feet. Counseled patient on potential for adverse effects with medications prescribed/recommended today, ER and return-to-clinic precautions discussed, patient verbalized understanding. ? ?  ?Jaynee Eagles, PA-C ?09/23/21 1421 ? ?

## 2021-10-06 DIAGNOSIS — H5213 Myopia, bilateral: Secondary | ICD-10-CM | POA: Diagnosis not present

## 2021-10-06 DIAGNOSIS — H2513 Age-related nuclear cataract, bilateral: Secondary | ICD-10-CM | POA: Diagnosis not present

## 2021-10-23 DIAGNOSIS — L308 Other specified dermatitis: Secondary | ICD-10-CM | POA: Diagnosis not present

## 2021-12-25 DIAGNOSIS — Z1331 Encounter for screening for depression: Secondary | ICD-10-CM | POA: Diagnosis not present

## 2021-12-25 DIAGNOSIS — E785 Hyperlipidemia, unspecified: Secondary | ICD-10-CM | POA: Diagnosis not present

## 2021-12-25 DIAGNOSIS — Z0000 Encounter for general adult medical examination without abnormal findings: Secondary | ICD-10-CM | POA: Diagnosis not present

## 2021-12-25 DIAGNOSIS — R7303 Prediabetes: Secondary | ICD-10-CM | POA: Diagnosis not present

## 2021-12-25 DIAGNOSIS — E669 Obesity, unspecified: Secondary | ICD-10-CM | POA: Diagnosis not present

## 2021-12-25 DIAGNOSIS — Z125 Encounter for screening for malignant neoplasm of prostate: Secondary | ICD-10-CM | POA: Diagnosis not present

## 2021-12-25 DIAGNOSIS — E041 Nontoxic single thyroid nodule: Secondary | ICD-10-CM | POA: Diagnosis not present

## 2022-03-15 ENCOUNTER — Telehealth: Payer: Self-pay | Admitting: *Deleted

## 2022-03-15 NOTE — Chronic Care Management (AMB) (Signed)
  Care Coordination   Note   03/15/2022 Name: Mischa Pollard MRN: [id number] DOB: 04/24/1971  Steaven Wholey is a 51 y.o. year old male who sees Velna Hatchet, MD for primary care. I reached out to Dorise Bullion by phone today to offer care coordination services.  Mr. Everitt was given information about Care Coordination services today including:   The Care Coordination services include support from the care team which includes your Nurse Coordinator, Clinical Social Worker, or Pharmacist.  The Care Coordination team is here to help remove barriers to the health concerns and goals most important to you. Care Coordination services are voluntary, and the patient may decline or stop services at any time by request to their care team member.   Care Coordination Consent Status: Patient did not agree to participate in care coordination services at this time.  Encounter Outcome:  Pt. Refused  [place]  Direct Dial: [phone]

## 2022-03-15 NOTE — Chronic Care Management (AMB) (Signed)
  Care Coordination  Outreach Note  03/15/2022 Name: Omega Slager MRN: [id number] DOB: 11-Feb-1971   Care Coordination Outreach Attempts  An unsuccessful telephone outreach was attempted today to offer the patient information about available care coordination services as a benefit of their health plan.   Follow Up Plan:  Additional outreach attempts will be made to offer the patient care coordination information and services.   Encounter Outcome:  No Answer  [place]  Direct Dial: [phone]

## 2022-06-28 DIAGNOSIS — D225 Melanocytic nevi of trunk: Secondary | ICD-10-CM | POA: Diagnosis not present

## 2022-06-28 DIAGNOSIS — D2272 Melanocytic nevi of left lower limb, including hip: Secondary | ICD-10-CM | POA: Diagnosis not present

## 2022-06-28 DIAGNOSIS — Z85828 Personal history of other malignant neoplasm of skin: Secondary | ICD-10-CM | POA: Diagnosis not present

## 2022-06-28 DIAGNOSIS — L814 Other melanin hyperpigmentation: Secondary | ICD-10-CM | POA: Diagnosis not present

## 2022-06-28 DIAGNOSIS — D1801 Hemangioma of skin and subcutaneous tissue: Secondary | ICD-10-CM | POA: Diagnosis not present

## 2022-07-30 DIAGNOSIS — M67912 Unspecified disorder of synovium and tendon, left shoulder: Secondary | ICD-10-CM | POA: Diagnosis not present

## 2022-07-30 DIAGNOSIS — R7303 Prediabetes: Secondary | ICD-10-CM | POA: Diagnosis not present

## 2022-09-21 DIAGNOSIS — Z111 Encounter for screening for respiratory tuberculosis: Secondary | ICD-10-CM | POA: Diagnosis not present

## 2022-09-24 DIAGNOSIS — Z0279 Encounter for issue of other medical certificate: Secondary | ICD-10-CM | POA: Diagnosis not present

## 2022-11-29 DIAGNOSIS — H10411 Chronic giant papillary conjunctivitis, right eye: Secondary | ICD-10-CM | POA: Diagnosis not present

## 2022-11-30 DIAGNOSIS — Z111 Encounter for screening for respiratory tuberculosis: Secondary | ICD-10-CM | POA: Diagnosis not present

## 2022-12-03 DIAGNOSIS — Z111 Encounter for screening for respiratory tuberculosis: Secondary | ICD-10-CM | POA: Diagnosis not present

## 2022-12-05 DIAGNOSIS — H5213 Myopia, bilateral: Secondary | ICD-10-CM | POA: Diagnosis not present

## 2022-12-05 DIAGNOSIS — H2513 Age-related nuclear cataract, bilateral: Secondary | ICD-10-CM | POA: Diagnosis not present

## 2022-12-24 DIAGNOSIS — N529 Male erectile dysfunction, unspecified: Secondary | ICD-10-CM | POA: Diagnosis not present

## 2022-12-24 DIAGNOSIS — Z1331 Encounter for screening for depression: Secondary | ICD-10-CM | POA: Diagnosis not present

## 2022-12-24 DIAGNOSIS — R7989 Other specified abnormal findings of blood chemistry: Secondary | ICD-10-CM | POA: Diagnosis not present

## 2022-12-24 DIAGNOSIS — R82998 Other abnormal findings in urine: Secondary | ICD-10-CM | POA: Diagnosis not present

## 2022-12-24 DIAGNOSIS — E041 Nontoxic single thyroid nodule: Secondary | ICD-10-CM | POA: Diagnosis not present

## 2022-12-24 DIAGNOSIS — R7303 Prediabetes: Secondary | ICD-10-CM | POA: Diagnosis not present

## 2022-12-24 DIAGNOSIS — Z125 Encounter for screening for malignant neoplasm of prostate: Secondary | ICD-10-CM | POA: Diagnosis not present

## 2022-12-24 DIAGNOSIS — E785 Hyperlipidemia, unspecified: Secondary | ICD-10-CM | POA: Diagnosis not present

## 2022-12-24 DIAGNOSIS — Z0000 Encounter for general adult medical examination without abnormal findings: Secondary | ICD-10-CM | POA: Diagnosis not present

## 2023-04-23 IMAGING — MR MR SHOULDER*R* W/O CM
5 series · 35 of 40 positions shown · non-contrast
Comparison: None.

CLINICAL DATA: Patient reports right shoulder pain x 1 year, known
calcific tendonitis. Patient denies cancer, therapeutic injections
or surgeries.

EXAM:
MRI OF THE RIGHT SHOULDER WITHOUT CONTRAST
TECHNIQUE: Multiplanar, multisequence MR imaging of the shoulder was performed.
No intravenous contrast was administered.

[Series 3: T2 fat-sat · axial · 4.0mm · 0.55mm/px · z∈[-26,+68]mm · 8 of 22 slices shown (1 of 3)]
[im 1/22]
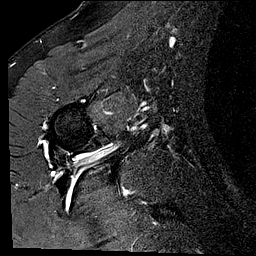
[im 3/22]
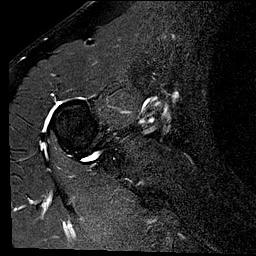
[im 8/22]
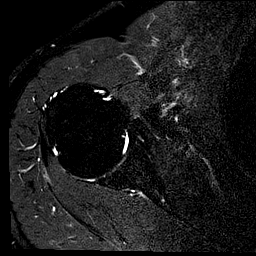
[im 10/22]
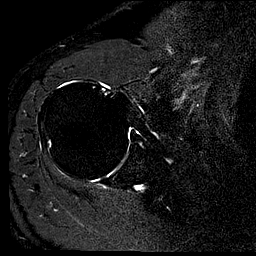
[im 12/22]
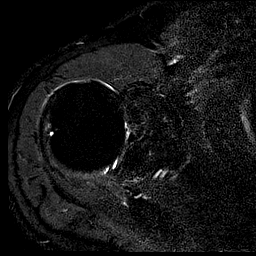
[im 15/22]
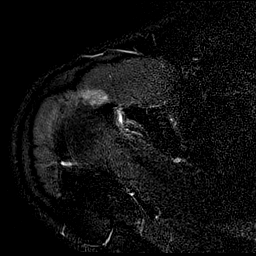
[im 19/22]
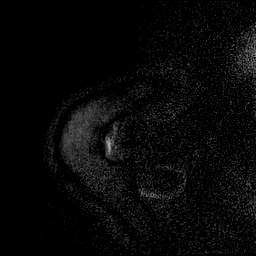
[im 22/22]
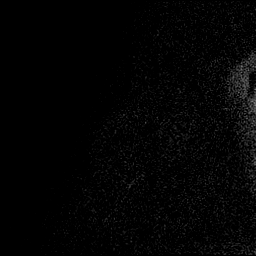

[Series 4: T2 fat-sat · oblique · 4.0mm · 0.59mm/px · 7 of 19 slices shown (2 of 3)]
[im 1/19]
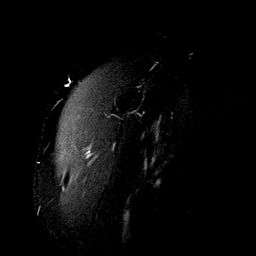
[im 4/19]
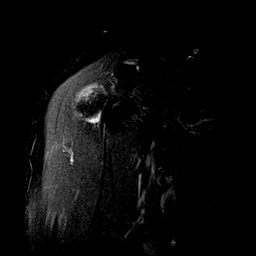
[im 7/19]
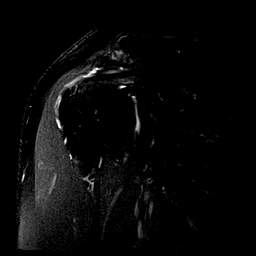
[im 10/19]
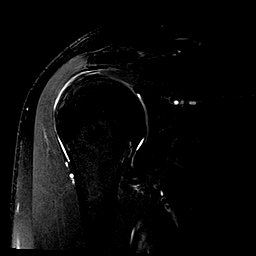
[im 13/19]
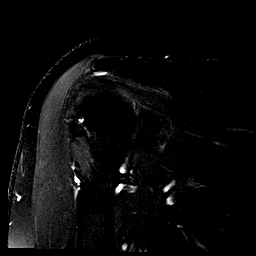
[im 16/19]
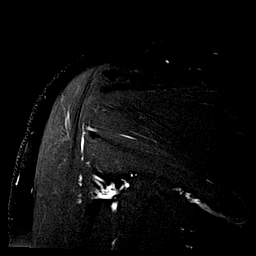
[im 19/19]
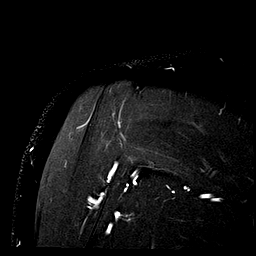

[Series 5: PD · oblique · 4.0mm · 0.29mm/px · 7 of 19 slices shown]
[im 1/19]
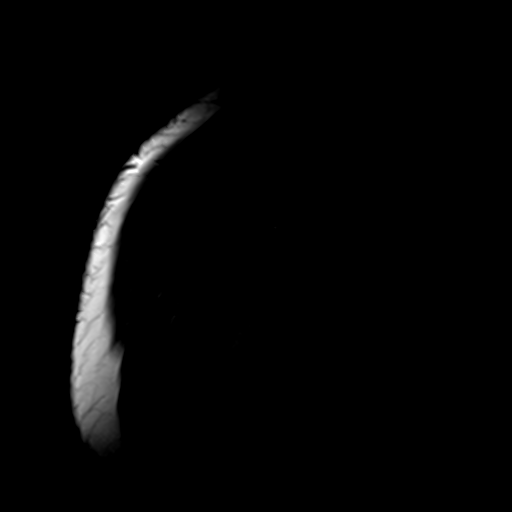
[im 4/19]
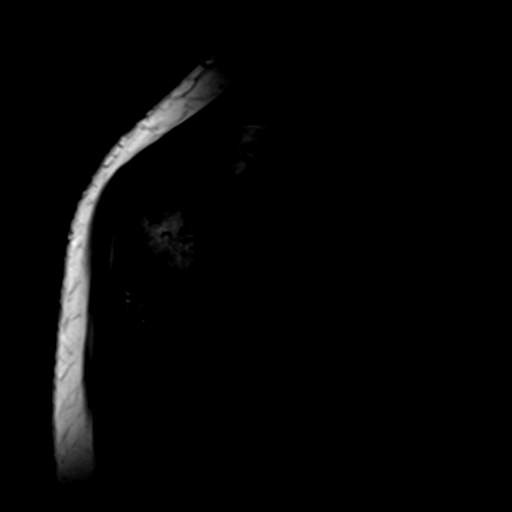
[im 7/19]
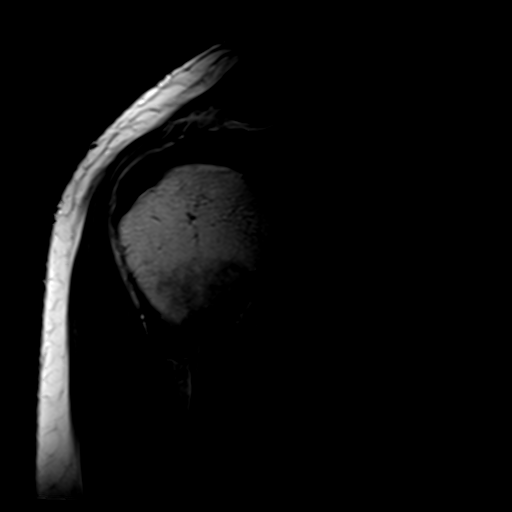
[im 10/19]
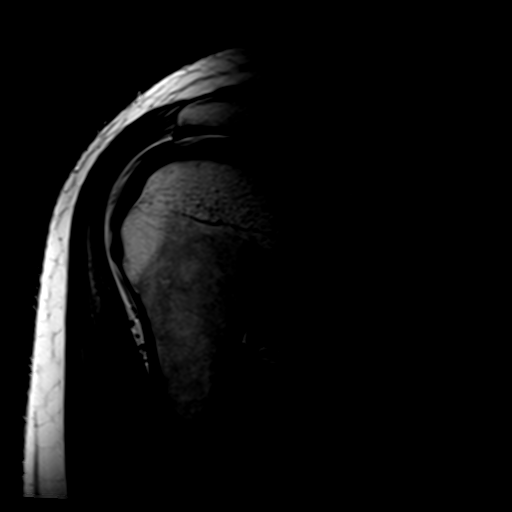
[im 13/19]
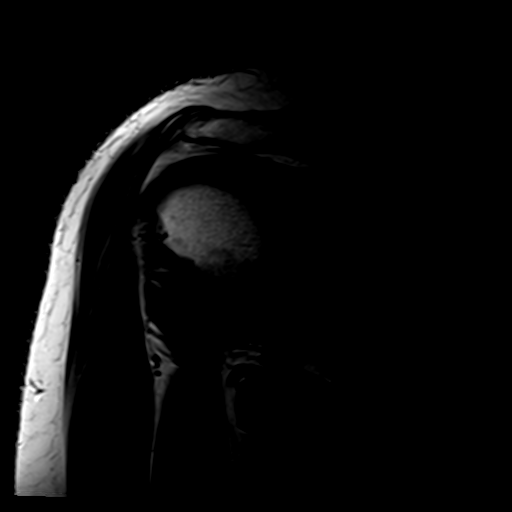
[im 16/19]
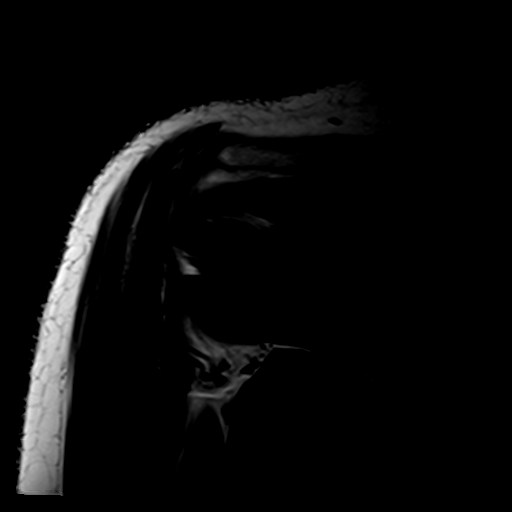
[im 19/19]
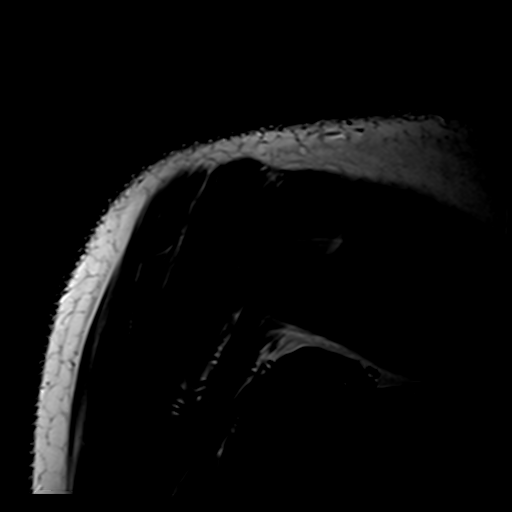

[Series 6: T2 fat-sat · oblique · 4.0mm · 0.59mm/px · 8 of 21 slices shown (3 of 3)]
[im 1/21]
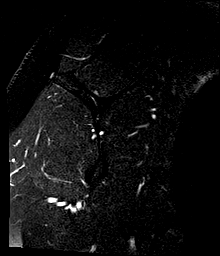
[im 3/21]
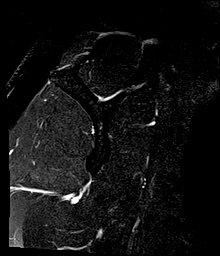
[im 6/21]
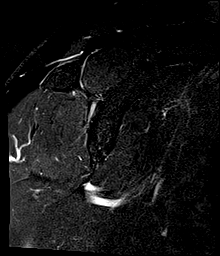
[im 9/21]
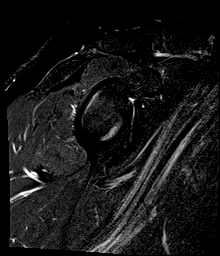
[im 12/21]
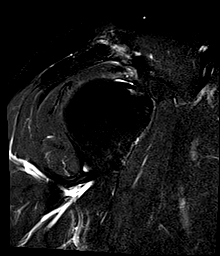
[im 15/21]
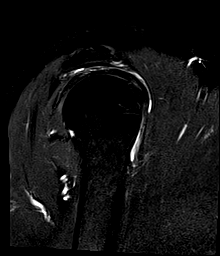
[im 18/21]
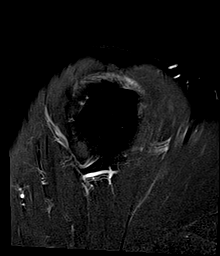
[im 21/21]
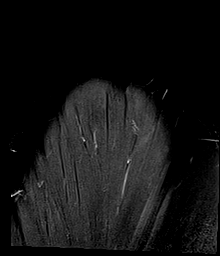

[Series 7: T1 · oblique · 4.0mm · 0.29mm/px · 5 of 21 slices shown]
[im 1/21]
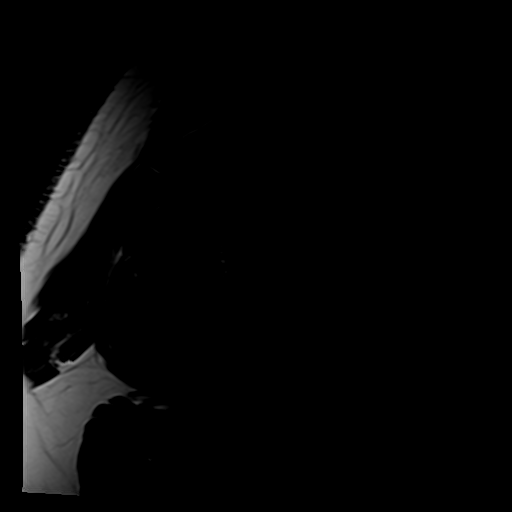
[im 3/21]
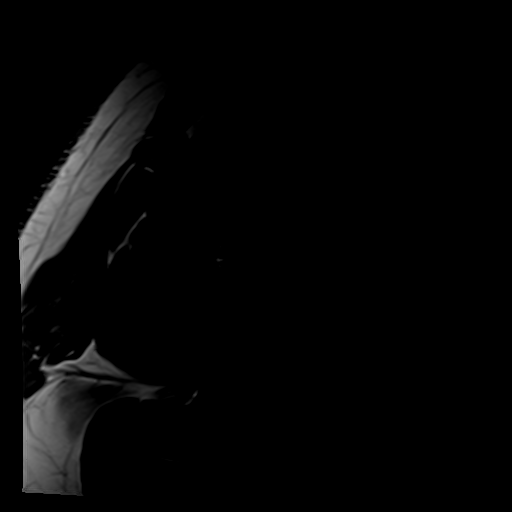
[im 6/21]
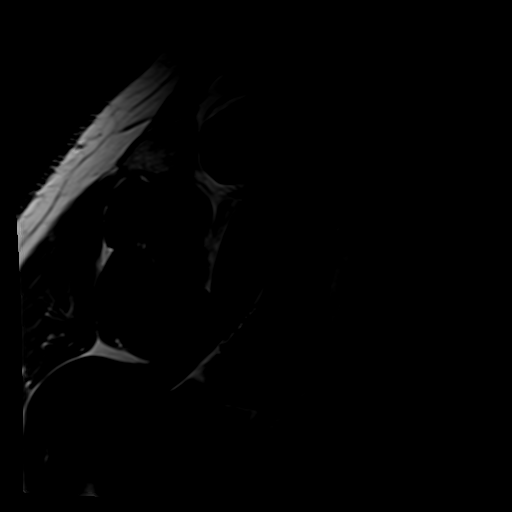
[im 9/21]
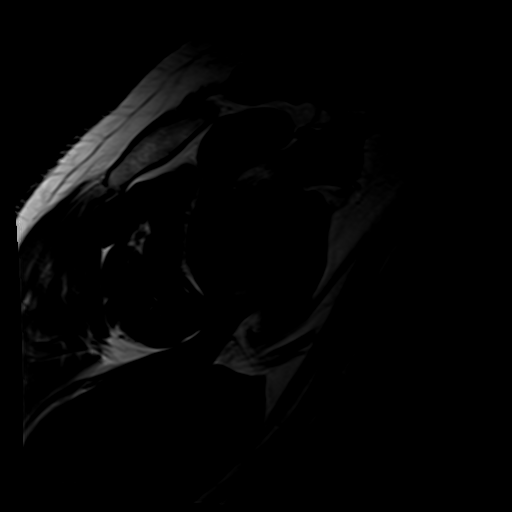
[im 12/21]
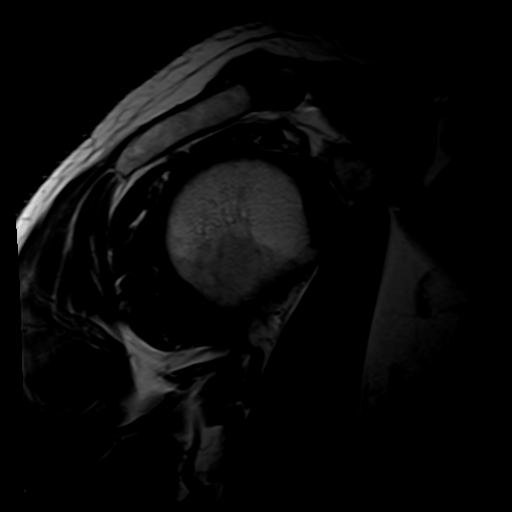

[35 of 40 positions shown; findings below may reference images not displayed]

FINDINGS: Rotator cuff: There is ill-defined globular low signal along the
bursal surface of the distal supraspinatus tendon which likely
reflects hydroxyapatite deposition based on provided history. Mild
associated supraspinatus and infraspinatus tendinosis without tear.
The subscapularis and teres minor tendons appear normal.

Muscles:  No focal muscular atrophy or edema.

Biceps long head:  Diminutive, but intact and normally positioned.

Acromioclavicular Joint: The acromion is type 2. There are mild
acromioclavicular degenerative changes. There is a small amount of
fluid within the subacromial-subdeltoid bursa.

Glenohumeral Joint: No significant shoulder joint effusion or
glenohumeral arthropathy.

Labrum: Labral assessment limited by the lack of joint fluid. Mild
posterior labral degeneration without displaced labral tear or
paralabral cyst.

Bones: No acute or significant extra-articular osseous findings.

Other: No significant soft tissue findings.
IMPRESSION: 1. Calcific supraspinatus tendinosis with mild associated
subacromial-subdeltoid bursitis.
2. Mild infraspinatus tendinosis.
3. No evidence of rotator cuff, labral or biceps tendon tear.
4. Mild acromioclavicular degenerative changes.
